# Patient Record
Sex: Male | Born: 1995 | Hispanic: No | Marital: Single | Smoking: Never smoker
Health system: Southern US, Community
[De-identification: ages and names within clinical notes are randomized; demographics above are authoritative.]

## PROBLEM LIST (undated history)

## (undated) DIAGNOSIS — M545 Low back pain: Secondary | ICD-10-CM

## (undated) HISTORY — PX: NO PAST SURGERIES: SHX2092

---
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
## 2013-01-20 IMAGING — CR DG CLAVICLE*L*
2 series · 2 of 2 positions shown · non-contrast
Comparison: Radiographs dated 08/16/2010

CLINICAL DATA: Left shoulder pain secondary to a fall today.

LEFT CLAVICLE - 2+ VIEWS

[w clavicle ap left]
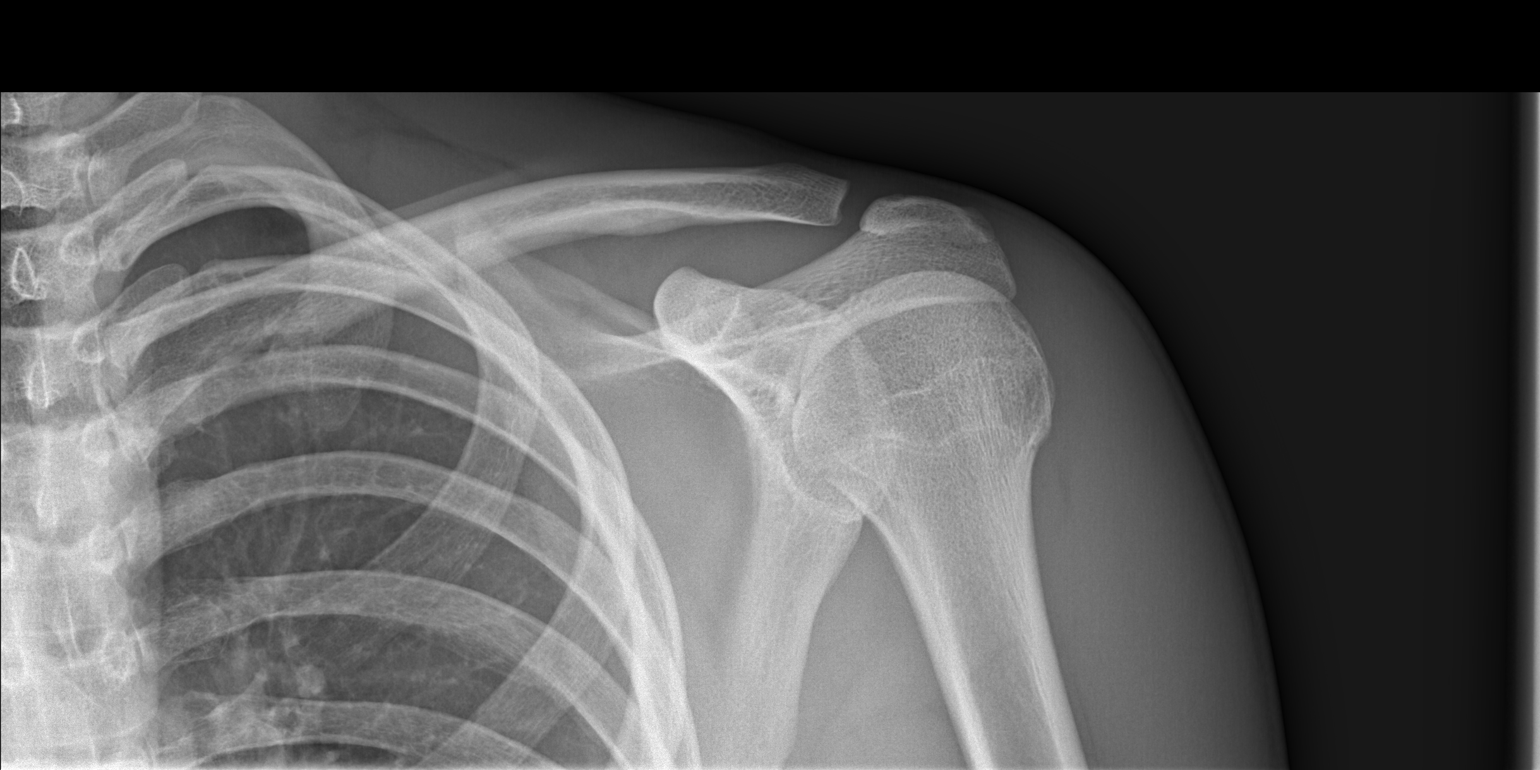

[w clavicle tangential left]
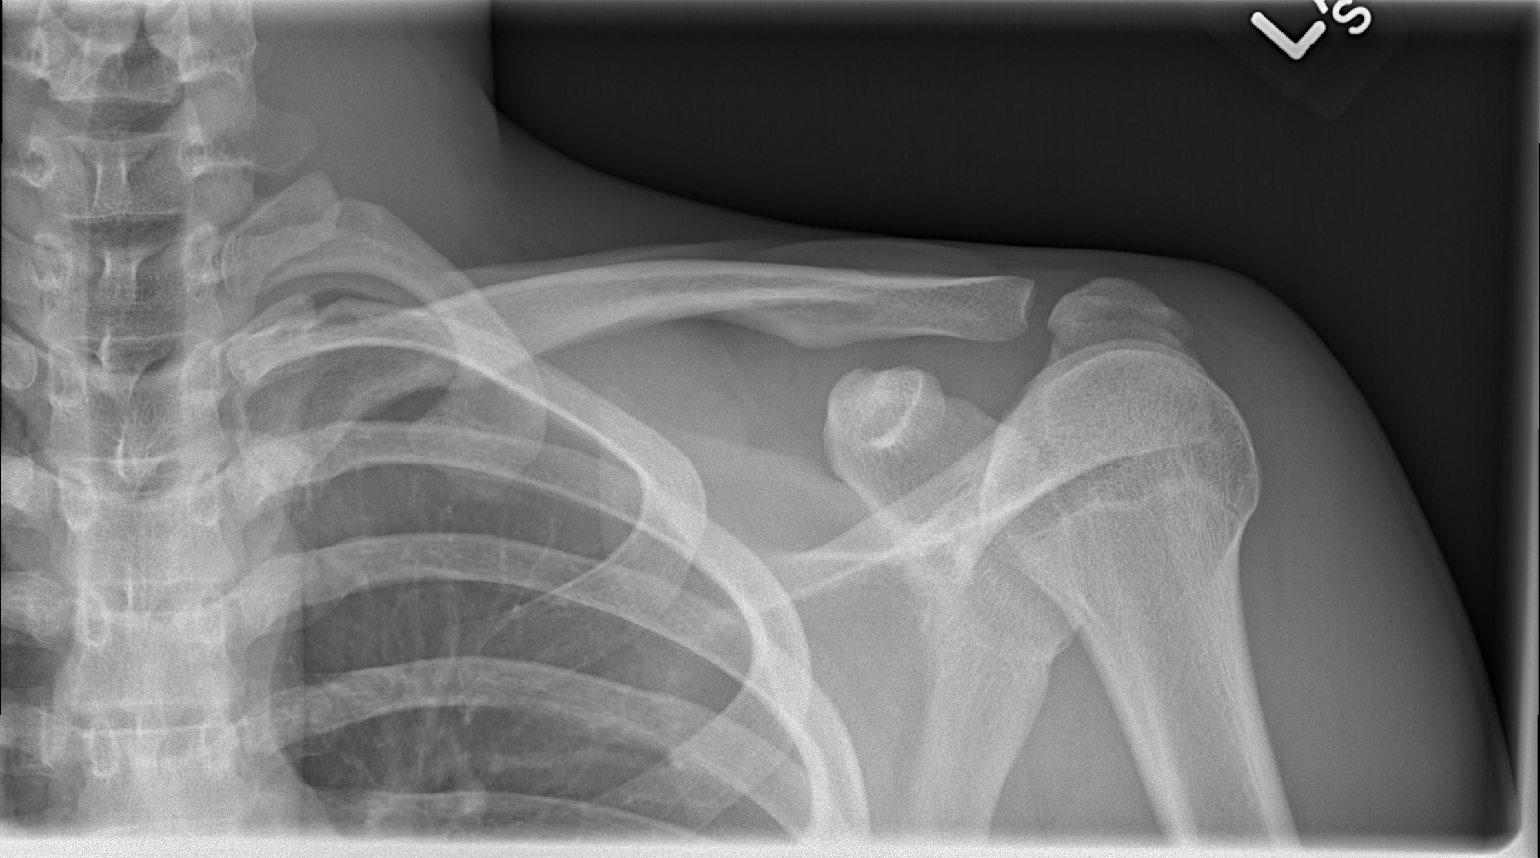

[2 of 2 positions shown; findings below may reference images not displayed]

FINDINGS: There is no fracture or dislocation.  The patient either
has an os acromiale or ununited acromial epiphysis.
IMPRESSION: No acute osseous abnormality.

## 2014-01-23 ENCOUNTER — Encounter (HOSPITAL_COMMUNITY): Payer: BC Managed Care – PPO | Admitting: Certified Registered"

## 2014-01-23 ENCOUNTER — Encounter (HOSPITAL_COMMUNITY)
Admission: AD | Disposition: A | Discharge status: Home / Self Care | Payer: Self-pay | Source: Ambulatory Visit | Attending: Oral Surgery

## 2014-01-23 ENCOUNTER — Encounter (HOSPITAL_COMMUNITY): Payer: Self-pay

## 2014-01-23 ENCOUNTER — Ambulatory Visit (HOSPITAL_COMMUNITY): Payer: BC Managed Care – PPO | Admitting: Certified Registered"

## 2014-01-23 ENCOUNTER — Ambulatory Visit (HOSPITAL_COMMUNITY)
Admission: AD | Admit: 2014-01-23 | Discharge: 2014-01-23 | Disposition: A | Discharge status: Home / Self Care | Payer: BC Managed Care – PPO | Source: Ambulatory Visit | Attending: Oral Surgery | Admitting: Oral Surgery

## 2014-01-23 DIAGNOSIS — IMO0002 Reserved for concepts with insufficient information to code with codable children: Secondary | ICD-10-CM | POA: Insufficient documentation

## 2014-01-23 DIAGNOSIS — Y9366 Activity, soccer: Secondary | ICD-10-CM | POA: Insufficient documentation

## 2014-01-23 DIAGNOSIS — F1210 Cannabis abuse, uncomplicated: Secondary | ICD-10-CM | POA: Insufficient documentation

## 2014-01-23 DIAGNOSIS — S02600A Fracture of unspecified part of body of mandible, initial encounter for closed fracture: Secondary | ICD-10-CM | POA: Insufficient documentation

## 2014-01-23 DIAGNOSIS — M264 Malocclusion, unspecified: Secondary | ICD-10-CM | POA: Insufficient documentation

## 2014-01-23 HISTORY — PX: CLOSED REDUCTION MANDIBLE: SHX5307

## 2014-01-23 SURGERY — CLOSED REDUCTION, MANDIBLE
Anesthesia: General | Laterality: Right

## 2014-01-23 MED ORDER — CEFAZOLIN SODIUM-DEXTROSE 2-3 GM-% IV SOLR
INTRAVENOUS | Status: AC
  Filled 2014-01-23: qty 50

## 2014-01-23 MED ORDER — FENTANYL CITRATE 0.05 MG/ML IJ SOLN
INTRAMUSCULAR | Status: AC
  Filled 2014-01-23: qty 5

## 2014-01-23 MED ORDER — PROPOFOL 10 MG/ML IV BOLUS
INTRAVENOUS | Status: AC
  Filled 2014-01-23: qty 20

## 2014-01-23 MED ORDER — MIDAZOLAM HCL 5 MG/5ML IJ SOLN
INTRAMUSCULAR | Status: DC | PRN
  Administered 2014-01-23: 2 mg via INTRAVENOUS

## 2014-01-23 MED ORDER — HYDROMORPHONE HCL PF 1 MG/ML IJ SOLN
INTRAMUSCULAR | Status: AC
  Filled 2014-01-23: qty 1

## 2014-01-23 MED ORDER — EPINEPHRINE HCL (NASAL) 0.1 % NA SOLN
NASAL | Status: AC
  Filled 2014-01-23: qty 30

## 2014-01-23 MED ORDER — MIDAZOLAM HCL 2 MG/2ML IJ SOLN
INTRAMUSCULAR | Status: AC
  Filled 2014-01-23: qty 2

## 2014-01-23 MED ORDER — DEXAMETHASONE SODIUM PHOSPHATE 4 MG/ML IJ SOLN
INTRAMUSCULAR | Status: DC | PRN
  Administered 2014-01-23: 8 mg via INTRAVENOUS

## 2014-01-23 MED ORDER — ROCURONIUM BROMIDE 50 MG/5ML IV SOLN
INTRAVENOUS | Status: AC
  Filled 2014-01-23: qty 1

## 2014-01-23 MED ORDER — CEFAZOLIN SODIUM-DEXTROSE 2-3 GM-% IV SOLR
2.0000 g | INTRAVENOUS | Status: AC
  Administered 2014-01-23: 2 g via INTRAVENOUS

## 2014-01-23 MED ORDER — FENTANYL CITRATE 0.05 MG/ML IJ SOLN: 50.0000 ug | Freq: Once | INTRAMUSCULAR | Status: DC

## 2014-01-23 MED ORDER — OXYCODONE HCL 5 MG/5ML PO SOLN
5.0000 mg | Freq: Once | ORAL | Status: AC | PRN
  Administered 2014-01-23: 5 mg via ORAL

## 2014-01-23 MED ORDER — LIDOCAINE HCL (CARDIAC) 20 MG/ML IV SOLN
INTRAVENOUS | Status: AC
  Filled 2014-01-23: qty 5

## 2014-01-23 MED ORDER — OXYMETAZOLINE HCL 0.05 % NA SOLN
NASAL | Status: AC
  Filled 2014-01-23: qty 15

## 2014-01-23 MED ORDER — BACIT-POLY-NEO HC 1 % EX OINT
TOPICAL_OINTMENT | CUTANEOUS | Status: AC
  Filled 2014-01-23: qty 15

## 2014-01-23 MED ORDER — ONDANSETRON HCL 4 MG/2ML IJ SOLN
INTRAMUSCULAR | Status: DC | PRN
  Administered 2014-01-23 (×2): 4 mg via INTRAVENOUS

## 2014-01-23 MED ORDER — HYDROMORPHONE HCL PF 1 MG/ML IJ SOLN
0.2500 mg | INTRAMUSCULAR | Status: DC | PRN
  Administered 2014-01-23 (×5): 0.5 mg via INTRAVENOUS

## 2014-01-23 MED ORDER — NEOSTIGMINE METHYLSULFATE 1 MG/ML IJ SOLN
INTRAMUSCULAR | Status: AC
  Filled 2014-01-23: qty 10

## 2014-01-23 MED ORDER — LIDOCAINE HCL (CARDIAC) 20 MG/ML IV SOLN
INTRAVENOUS | Status: DC | PRN
  Administered 2014-01-23: 100 mg via INTRAVENOUS

## 2014-01-23 MED ORDER — OXYCODONE HCL 5 MG/5ML PO SOLN
ORAL | Status: AC
  Filled 2014-01-23: qty 5

## 2014-01-23 MED ORDER — CEFAZOLIN SODIUM-DEXTROSE 2-3 GM-% IV SOLR: 2.0000 g | INTRAVENOUS | Status: DC

## 2014-01-23 MED ORDER — MIDAZOLAM HCL 2 MG/2ML IJ SOLN: 1.0000 mg | INTRAMUSCULAR | Status: DC | PRN

## 2014-01-23 MED ORDER — ROCURONIUM BROMIDE 100 MG/10ML IV SOLN
INTRAVENOUS | Status: DC | PRN
  Administered 2014-01-23: 40 mg via INTRAVENOUS

## 2014-01-23 MED ORDER — LACTATED RINGERS IV SOLN
INTRAVENOUS | Status: DC
  Administered 2014-01-23: 15:00:00 via INTRAVENOUS

## 2014-01-23 MED ORDER — GLYCOPYRROLATE 0.2 MG/ML IJ SOLN
INTRAMUSCULAR | Status: AC
  Filled 2014-01-23: qty 3

## 2014-01-23 MED ORDER — LACTATED RINGERS IV SOLN
INTRAVENOUS | Status: DC | PRN
  Administered 2014-01-23 (×2): via INTRAVENOUS

## 2014-01-23 MED ORDER — ONDANSETRON HCL 4 MG/2ML IJ SOLN
INTRAMUSCULAR | Status: AC
  Filled 2014-01-23: qty 2

## 2014-01-23 MED ORDER — FENTANYL CITRATE 0.05 MG/ML IJ SOLN
INTRAMUSCULAR | Status: DC | PRN
  Administered 2014-01-23: 100 ug via INTRAVENOUS
  Administered 2014-01-23: 50 ug via INTRAVENOUS
  Administered 2014-01-23: 100 ug via INTRAVENOUS

## 2014-01-23 MED ORDER — PROMETHAZINE HCL 25 MG/ML IJ SOLN
INTRAMUSCULAR | Status: AC
  Filled 2014-01-23: qty 1

## 2014-01-23 MED ORDER — LIDOCAINE-EPINEPHRINE 1 %-1:100000 IJ SOLN
INTRAMUSCULAR | Status: AC
  Filled 2014-01-23: qty 1

## 2014-01-23 MED ORDER — GLYCOPYRROLATE 0.2 MG/ML IJ SOLN
INTRAMUSCULAR | Status: DC | PRN
  Administered 2014-01-23: .6 mg via INTRAVENOUS

## 2014-01-23 MED ORDER — PROPOFOL 10 MG/ML IV BOLUS
INTRAVENOUS | Status: DC | PRN
  Administered 2014-01-23: 180 mg via INTRAVENOUS

## 2014-01-23 MED ORDER — OXYCODONE HCL 5 MG PO TABS: 5.0000 mg | ORAL_TABLET | Freq: Once | ORAL | Status: AC | PRN

## 2014-01-23 MED ORDER — NEOSTIGMINE METHYLSULFATE 1 MG/ML IJ SOLN
INTRAMUSCULAR | Status: DC | PRN
  Administered 2014-01-23: 4 mg via INTRAVENOUS

## 2014-01-23 MED ORDER — HYDROCODONE-ACETAMINOPHEN 7.5-325 MG/15ML PO SOLN: 15.0000 mL | ORAL | Status: AC | PRN

## 2014-01-23 MED ORDER — PROMETHAZINE HCL 25 MG/ML IJ SOLN
6.2500 mg | INTRAMUSCULAR | Status: DC | PRN
  Administered 2014-01-23: 6.25 mg via INTRAVENOUS

## 2014-01-23 SURGICAL SUPPLY — 30 items
CANISTER SUCTION 2500CC (MISCELLANEOUS) ×3 IMPLANT
CLEANER TIP ELECTROSURG 2X2 (MISCELLANEOUS) ×3 IMPLANT
CLOTH BEACON ORANGE TIMEOUT ST (SAFETY) ×3 IMPLANT
COVER SURGICAL LIGHT HANDLE (MISCELLANEOUS) ×6 IMPLANT
DECANTER SPIKE VIAL GLASS SM (MISCELLANEOUS) ×3 IMPLANT
ELECT REM PT RETURN 9FT ADLT (ELECTROSURGICAL) ×3
ELECTRODE REM PT RTRN 9FT ADLT (ELECTROSURGICAL) ×1 IMPLANT
GLOVE BIO SURGEON STRL SZ 6.5 (GLOVE) ×2 IMPLANT
GLOVE BIO SURGEON STRL SZ7.5 (GLOVE) ×3 IMPLANT
GLOVE BIO SURGEONS STRL SZ 6.5 (GLOVE) ×1
GOWN STRL NON-REIN LRG LVL3 (GOWN DISPOSABLE) ×6 IMPLANT
KIT BASIN OR (CUSTOM PROCEDURE TRAY) ×3 IMPLANT
KIT ROOM TURNOVER OR (KITS) ×3 IMPLANT
NS IRRIG 1000ML POUR BTL (IV SOLUTION) ×3 IMPLANT
PAD ARMBOARD 7.5X6 YLW CONV (MISCELLANEOUS) ×6 IMPLANT
PENCIL BUTTON HOLSTER BLD 10FT (ELECTRODE) ×3 IMPLANT
SCISSORS WIRE ANG 4 3/4 DISP (INSTRUMENTS) ×3 IMPLANT
SUT CHROMIC 3 0 PS 2 (SUTURE) ×3 IMPLANT
SUT CHROMIC 4 0 PS 2 18 (SUTURE) ×3 IMPLANT
SUT ETHILON 5 0 P 3 18 (SUTURE) ×2
SUT NYLON ETHILON 5-0 P-3 1X18 (SUTURE) ×1 IMPLANT
SUT SILK 2 0 FS (SUTURE) IMPLANT
SUT STEEL 0 (SUTURE)
SUT STEEL 0 18XMFL TIE 17 (SUTURE) IMPLANT
SUT STEEL 1 (SUTURE) ×6 IMPLANT
SUT STEEL 4 (SUTURE) IMPLANT
TOWEL OR 17X24 6PK STRL BLUE (TOWEL DISPOSABLE) ×3 IMPLANT
TOWEL OR 17X26 10 PK STRL BLUE (TOWEL DISPOSABLE) ×3 IMPLANT
TRAY ENT MC OR (CUSTOM PROCEDURE TRAY) ×3 IMPLANT
WATER STERILE IRR 1000ML POUR (IV SOLUTION) ×3 IMPLANT

## 2014-01-23 NOTE — H&P (Signed)
Brandon ForesterMarco Mahoney is an [age] y.o. male.   Chief Complaint: pain bite off right mandible HPI: hit in face during soccer practice saturday  Past Medical History  Diagnosis Date  . Lower back pain 2010    stress fx- lumbar region    Past Surgical History  Procedure Laterality Date  . No past surgeries      No family history on file. Social History:  reports that he has never smoked. He does not have any smokeless tobacco history on file. He reports that he uses illicit drugs (Marijuana). He reports that he does not drink alcohol.  Allergies: No Known Allergies  Medications Prior to Admission  Medication Sig Dispense Refill  . oxyCODONE-acetaminophen (PERCOCET/ROXICET) 5-325 MG per tablet Take 1 tablet by mouth every 6 (six) hours as needed for severe pain.        No results found for this or any previous visit (from the past 48 hour(s)). No results found.  ROS  Blood pressure 150/81, pulse 60, temperature 98.1 F (36.7 C), temperature source Oral, resp. rate 18, height 5\' 9"  (1.753 m), weight 68.04 kg (150 lb), SpO2 100.00%. Physical Exam  Constitutional: He is oriented to person, place, and time. He appears well-developed and well-nourished.  HENT:  Head: Normocephalic. Head is with abrasion and with contusion.    Right Ear: External ear normal.  Left Ear: External ear normal.  Mouth/Throat: Oropharynx is clear and moist.    Eyes: Conjunctivae and EOM are normal. Pupils are equal, round, and reactive to light.  Neck: Normal range of motion. Neck supple.  Cardiovascular: Normal rate, regular rhythm, normal heart sounds and intact distal pulses.   Respiratory: Effort normal and breath sounds normal.  GI: Soft. Bowel sounds are normal.  Musculoskeletal: Normal range of motion.  Neurological: He is alert and oriented to person, place, and time. He has normal reflexes.  Skin: Skin is warm and dry.  Psychiatric: He has a normal mood and affect. His behavior is normal. Judgment and  thought content normal.     Assessment/Plan Fractured mandible right body  Hermila Millis,JOSEPH L 01/23/2014, 3:28 PM

## 2014-01-23 NOTE — Pre-Procedure Instructions (Signed)
Correction,  Space between teeth is between #28 and #29.

## 2014-01-23 NOTE — Anesthesia Procedure Notes (Signed)
Procedure Name: Intubation Date/Time: 01/23/2014 5:10 PM Performed by: Armandina GemmaMIRARCHI, Audiel Scheiber Pre-anesthesia Checklist: Patient identified, Timeout performed, Emergency Drugs available, Suction available and Patient being monitored Patient Re-evaluated:Patient Re-evaluated prior to inductionOxygen Delivery Method: Circle system utilized Preoxygenation: Pre-oxygenation with 100% oxygen Intubation Type: IV induction Grade View: Grade I Nasal Tubes: Right and Nasal Rae Tube size: 7.0 mm Number of attempts: 1 Placement Confirmation: ETT inserted through vocal cords under direct vision,  breath sounds checked- equal and bilateral and positive ETCO2 Secured at: 29 cm Tube secured with: Tape Dental Injury: Teeth and Oropharynx as per pre-operative assessment  Comments: IV induction Kasik- 14 fr red rubber to the end of 7.0 nasal rae tube- 14 fr red rubber inserted to right nare by Kasik- followed by 7.0 RAE nasal- Glidescope- inserted- ETT thru cords- atraumatic teeth and mouth as preop- bilat BS Kasik

## 2014-01-23 NOTE — Op Note (Signed)
The patient was brought to the operating room in the supine position and which remained throughout the whole procedure. He was intubated via right nasoendotracheal tube and prepped and draped in usual fashion for an intraoral procedure. The mouth was prepped with Betadine scrub and paint. An open moist 4 x 4 gauze was placed around the endotracheal tube. The maxillary arch was measured from tooth #3 to #14. An Erich arch bar was cut to fit the arch. 25 gauge wire was then used to pass around each of the teeth including #3 through #14. Each wire was then passed around the [place] arch part and turned into the mucobuccal fold in a clockwise fashion. The arch bar was checked for stability. The mandibular fracture was found to be unstable in mobility and a wire was passed around #28 and #29. The wire was tightened down bringing the fractures together and stabilizing the fractures. Lingual palpation was also done to position the fracture in the proper relationship. The occlusion was checked at this point and appear appropriate. The mandibular arch was then measured from #19 to #30. In [place] arch bar was cut to fit the mandibular arch. 25-gauge stainless steel wires were passed around the teeth extending from #19 to #30 and around the [facility] arch bar. Each wire was then turned in a clockwise fashion into the mucobuccal fold and then downward. The mandible was then placed against the maxilla found to be in the same position. The throat pack was removed after suctioning out the throat. 25-gauge stainless steel IMF wires were then passed to hold the Erich arch bars together. The fractures appeared stable. The inferior border appears to be symmetrical. The patient was then extubated on the table and returned to the recovery room in good condition. The patient already has amoxicillin elixir and pain medication. He will be followed by me and get a postop x-ray in the morning in my office.

## 2014-01-23 NOTE — Consult Note (Signed)
  This is a 18 y/o Hispanic male who was struck in the face while practicing soccer.  He was hit on the right side of the jaw.  There is a paresthesia of the lower right lip and chin.  There is a malocclusion as well with a wide space between #27 and #28.  Xrays show a moderately displaced body of the mandible fracture in a sagittal plane.  The treatment plan is to do a closed reduction with [place]ece FittingErich Arch bars and IMF. We will open the fracture only as necessary.

## 2014-01-23 NOTE — H&P (Signed)
No change in medical history.  

## 2014-01-23 NOTE — Preoperative (Signed)
Beta Blockers   Reason not to administer Beta Blockers:Not Applicable 

## 2014-01-23 NOTE — Transfer of Care (Signed)
Immediate Anesthesia Transfer of Care Note  Patient: Brandon ForesterMarco Mahoney  Procedure(s) Performed: Procedure(s): CLOSED REDUCTION MANDIBLE WITH ARCH BAR FIXATION (Right)  Patient Location: PACU  Anesthesia Type:General  Level of Consciousness: awake and alert   Airway & Oxygen Therapy: Patient Spontanous Breathing and Patient connected to face mask oxygen  Post-op Assessment: Report given to PACU RN and Post -op Vital signs reviewed and stable  Post vital signs: Reviewed and stable  Complications: No apparent anesthesia complications

## 2014-01-23 NOTE — Anesthesia Postprocedure Evaluation (Signed)
Anesthesia Post Note  Patient: Brandon Mahoney  Procedure(s) Performed: Procedure(s) (LRB): CLOSED REDUCTION MANDIBLE WITH ARCH BAR FIXATION (Right)  Anesthesia type: General  Patient location: PACU  Post pain: Pain level controlled and Adequate analgesia  Post assessment: Post-op Vital signs reviewed, Patient's Cardiovascular Status Stable, Respiratory Function Stable, Patent Airway and Pain level controlled  Last Vitals:  Filed Vitals:   01/23/14 1900  BP: 150/85  Pulse: 55  Temp:   Resp: 12    Post vital signs: Reviewed and stable  Level of consciousness: awake, alert  and oriented  Complications: No apparent anesthesia complications

## 2014-01-23 NOTE — Progress Notes (Signed)
Call to Dr. Hyacinth MeekerMiller 's office, requested that orders be entered into EPIC.

## 2014-01-23 NOTE — Anesthesia Preprocedure Evaluation (Addendum)
Anesthesia Evaluation  Patient identified by MRN, date of birth, ID band Patient awake    Reviewed: Allergy & Precautions, H&P , NPO status , Patient's Chart, lab work & pertinent test results  Airway Mallampati: II TM Distance: >3 FB Neck ROM: Limited  Mouth opening: Limited Mouth Opening  Dental  (+) Dental Advisory Given   Pulmonary  breath sounds clear to auscultation        Cardiovascular Rhythm:Regular Rate:Normal     Neuro/Psych    GI/Hepatic (+)     substance abuse  marijuana use,   Endo/Other    Renal/GU      Musculoskeletal   Abdominal   Peds  Hematology   Anesthesia Other Findings Mandible fx  Reproductive/Obstetrics                          Anesthesia Physical Anesthesia Plan  ASA: II  Anesthesia Plan: General   Post-op Pain Management:    Induction: Intravenous  Airway Management Planned: Nasal ETT and Video Laryngoscope Planned  Additional Equipment:   Intra-op Plan:   Post-operative Plan: Extubation in OR  Informed Consent: I have reviewed the patients History and Physical, chart, labs and discussed the procedure including the risks, benefits and alternatives for the proposed anesthesia with the patient or authorized representative who has indicated his/her understanding and acceptance.     Plan Discussed with: CRNA and Surgeon  Anesthesia Plan Comments:         Anesthesia Quick Evaluation

## 2014-01-23 NOTE — H&P (Signed)
  This is a 18 y/o Hispanic male who was struck in the face while practicing soccer.  He was hit on the right side of the jaw.  There is a paresthesia of the lower right lip and chin.  There is a malocclusion as well with a wide space between #27 and #28.  Xrays show a moderately displaced body of the mandible fracture in a sagittal plane.  The treatment plan is to do a closed reduction with Erich Arch bars and IMF. We will open the fracture only as necessary.  

## 2014-01-23 NOTE — Brief Op Note (Signed)
01/23/2014  6:27 PM  PATIENT:  Warnell ForesterMarco Waggoner  18 y.o. male  PRE-OPERATIVE DIAGNOSIS:  Right Mandible Fx  POST-OPERATIVE DIAGNOSIS:  same  PROCEDURE:  Procedure(s): CLOSED REDUCTION MANDIBLE WITH ARCH BAR FIXATION (Right)  SURGEON:  Surgeon(s) and Role:    * Hinton DyerJoseph L Redford Behrle, DDS - Primary  PHYSICIAN ASSISTANT:   ASSISTANTS: OR tech    ANESTHESIA:   general  EBL:  Total I/O In: 1000 [I.V.:1000] Out: 10 [Blood:10]  BLOOD ADMINISTERED:none  DRAINS: none   LOCAL MEDICATIONS USED:  NONE  SPECIMEN:  No Specimen  DISPOSITION OF SPECIMEN:  N/A  COUNTS:  YES  TOURNIQUET:  * No tourniquets in log *  DICTATION: .Dragon Dictation  PLAN OF CARE: Discharge to home after PACU  PATIENT DISPOSITION:  PACU - hemodynamically stable.   Delay start of Pharmacological VTE agent (>24hrs) due to surgical blood loss or risk of bleeding: not applicable

## 2014-01-24 ENCOUNTER — Encounter (HOSPITAL_COMMUNITY): Payer: Self-pay | Admitting: Oral Surgery

## 2014-04-17 ENCOUNTER — Other Ambulatory Visit: Payer: Self-pay | Admitting: Family Medicine

## 2014-04-17 ENCOUNTER — Encounter: Payer: Self-pay | Admitting: Family Medicine

## 2014-04-17 DIAGNOSIS — S7010XA Contusion of unspecified thigh, initial encounter: Secondary | ICD-10-CM | POA: Insufficient documentation

## 2014-04-17 MED ORDER — NITROGLYCERIN 0.2 MG/HR TD PT24: MEDICATED_PATCH | TRANSDERMAL | Status: AC

## 2014-04-17 MED ORDER — MELOXICAM 15 MG PO TABS
15.0000 mg | ORAL_TABLET | Freq: Every day | ORAL | Status: AC
Start: 2014-04-17 — End: ?

## 2014-04-17 NOTE — Assessment & Plan Note (Signed)
Patient does have a quadricep tear and patient's knee seems to be stable. Patient will begin compression sleeve, icing protocol, anti-inflammatories a nitroglycerin patch. 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 Patient is planning on a [place]iprofessional [place]. Patient will come back in 2 weeks for further evaluation.

## 2014-04-17 NOTE — Patient Instructions (Signed)
Meloxicam daily 10 days then as needed Ice 20 minutes 2 times daily.  Exercises 3 times weekly.  Nitroglycerin Protocol   Apply 1/4 nitroglycerin patch to affected area daily.  Change position of patch within the affected area every 24 hours.  You may experience a headache during the first 1-2 weeks of using the patch, these should subside.  If you experience headaches after beginning nitroglycerin patch treatment, you may take your preferred over the counter pain reliever.  Another side effect of the nitroglycerin patch is skin irritation or rash related to patch adhesive.  Please notify our office if you develop more severe headaches or rash, and stop the patch.  Tendon healing with nitroglycerin patch may require 12 to 24 weeks depending on the extent of injury.  Men should not use if taking Viagra, Cialis, or Levitra.   Do not use if you have migraines or rosacea.   Come back in 2 weeks.  Cell [phone] zach Katrinka Blazingsmith

## 2014-04-17 NOTE — Progress Notes (Signed)
Tawana ScaleZach Smith D.O. [facility] [address] [place], [place] [address] Phone: [phone] Subjective:     CC: left knee pain.   QIO:[id number]:Subjective Brandon ForesterMarco Mahoney is a 18 y.o. male coming in with complaint of left knee pain. Patient played in a soccer game on Saturday evening. Patient was unfortunately slide tackle and a significant amount of pain when the knee it just above his left knee. Patient denies been plated. Patient states that he has significant swelling and bruising also medially. Patient was unable to bear weight and still does not want to bear weight on this leg. Denies any radiation of pain or any numbness. Patient describes it is more of a dull aching pain in does have sharp pain when he tries to bear weight. Patient denies any instability of the knee though. Denies any pain of the knee. Patient has never injured this leg previously. Patient is a severity of 8/10. Has tried some over-the-counter medications with minimal improvement.     Past medical history, social, surgical and family history all reviewed in electronic medical record.   Review of Systems: No headache, visual changes, nausea, vomiting, diarrhea, constipation, dizziness, abdominal pain, skin rash, fevers, chills, night sweats, weight loss, swollen lymph nodes, body aches, joint swelling, muscle aches, chest pain, shortness of breath, mood changes.   Objective There were no vitals taken for this visit.  General: No apparent distress alert and oriented x3 mood and affect normal, dressed appropriately.  HEENT: Pupils equal, extraocular movements intact  Respiratory: Patient's speak in full sentences and does not appear short of breath  Cardiovascular: No lower extremity edema, non tender, no erythema  Skin: Warm dry intact with no signs of infection or rash on extremities or on axial skeleton.  Abdomen: Soft nontender  Neuro: Cranial nerves II through XII are intact, neurovascularly intact in all extremities  with 2+ DTRs and 2+ pulses.  Lymph: No lymphadenopathy of posterior or anterior cervical chain or axillae bilaterally.  Gait normal with good balance and coordination.  MSK:  Non tender with full range of motion and good stability and symmetric strength and tone of shoulders, elbows, wrist, hip,a nd ankles bilaterally.  Knee: Left Normal to inspection patient does have a good amount of swelling of the medial and anterior aspect of the quadricep. Palpation normal with no warmth, joint line tenderness, patellar tenderness, patient has tenderness over the quadricep and mild over the condyle of the medial aspect of the left knee. ROM his decrease in flexion secondary to swelling full extension noted. Ligaments with solid consistent endpoints including ACL, PCL, LCL, MCL. Negative Mcmurray's, Apley's, and Thessalonian tests. Non painful patellar compression. Patellar glide without crepitus. Patellar and quadriceps tendons unremarkable. Hamstring and quadriceps strength is normal.  Contralateral knee unremarkable.  MSK US performed of: Left knee This study was ordered, performed, and interpreted by Terrilee FilesZach Smith D.O.  Knee: All structures visualized. Anteromedial, anterolateral, posteromedial, and posterolateral menisci unremarkable without tearing, fraying, effusion, or displacement. Patient does have a tear that measures 1.36 cm in diameter of the vastus medialis oblique muscle. This is on the medial border. Right at the musculotendinous juncture. Increasing Doppler flow noted. Patellar Tendon unremarkable on long and transverse views without effusion. No abnormality of prepatellar bursa. LCL and MCL unremarkable on long and transverse views. No abnormality of origin of medial or lateral head of the gastrocnemius.  IMPRESSION:  Quadricep contusion and tear.     Impression and Recommendations:     This case required  medical decision making of moderate complexity.

## 2014-05-02 ENCOUNTER — Ambulatory Visit: Payer: 59 | Admitting: Family Medicine

## 2014-05-02 ENCOUNTER — Encounter: Payer: Self-pay | Admitting: Family Medicine

## 2014-05-02 DIAGNOSIS — S7010XA Contusion of unspecified thigh, initial encounter: Secondary | ICD-10-CM

## 2014-05-02 NOTE — Progress Notes (Signed)
  [facility] [address] [place], [place] [address] Phone: [phone] Subjective:     CC: left knee pain. Followup  OAC:[id number] Brandon Mahoney is a 18 y.o. male coming in with complaint of left knee pain. Patient states that the pain is significantly better at this time. Patient is actually having no pain and is able to ambulate without any trouble. Patient is no longer taking anti-inflammatories and is only wearing the brace as needed. Patient has not been doing any exercises though. Patient states that it feels somewhat weak compared to the other side but overall is feeling much better. No new symptoms.     Past medical history, social, surgical and family history all reviewed in electronic medical record.   Review of Systems: No headache, visual changes, nausea, vomiting, diarrhea, constipation, dizziness, abdominal pain, skin rash, fevers, chills, night sweats, weight loss, swollen lymph nodes, body aches, joint swelling, muscle aches, chest pain, shortness of breath, mood changes.   Objective There were no vitals taken for this visit.  General: No apparent distress alert and oriented x3 mood and affect normal, dressed appropriately.  HEENT: Pupils equal, extraocular movements intact  Respiratory: Patient's speak in full sentences and does not appear short of breath  Cardiovascular: No lower extremity edema, non tender, no erythema  Skin: Warm dry intact with no signs of infection or rash on extremities or on axial skeleton.  Abdomen: Soft nontender  Neuro: Cranial nerves II through XII are intact, neurovascularly intact in all extremities with 2+ DTRs and 2+ pulses.  Lymph: No lymphadenopathy of posterior or anterior cervical chain or axillae bilaterally.  Gait normal with good balance and coordination.  MSK:  Non tender with full range of motion and good stability and symmetric strength and tone of shoulders, elbows, wrist, hip,a nd ankles  bilaterally.  Knee: Left No swelling noted. Palpation normal with no warmth, joint line tenderness, patellar tenderness,  Nontender on exam Full range of motion Ligaments with solid consistent endpoints including ACL, PCL, LCL, MCL. Negative Mcmurray's, Apley's, and Thessalonian tests. Non painful patellar compression. Patellar glide without crepitus. Patellar and quadriceps tendons unremarkable. Hamstring and quadriceps strength is normal.  Contralateral knee unremarkable.  MSK US performed of: Left knee This study was ordered, performed, and interpreted by Terrilee Files D.O.  Knee: All structures visualized. Anteromedial, anterolateral, posteromedial, and posterolateral menisci unremarkable without tearing, fraying, effusion, or displacement. Area of tear in the vastus medialis oblique muscle has completely resolved with scar tissue formation no. No gapping noted. Patellar Tendon unremarkable on long and transverse views without effusion. No abnormality of prepatellar bursa. LCL and MCL unremarkable on long and transverse views. No abnormality of origin of medial or lateral head of the gastrocnemius.  IMPRESSION:  Quadricep tear healed with scar tissue formation     Impression and Recommendations:     This case required medical decision making of moderate complexity.

## 2014-05-02 NOTE — Assessment & Plan Note (Signed)
Patient's tear has completely healed at this time. Patient is doing very well and will start phase 2 of exercises. Patient given some more strengthening exercises as well as starting to do more specific activity. Patient was given a gradual progression over the course of the next 2 weeks to get him back again physical activity. Patient will continue to nitroglycerin patch at this time and will wear her brace as needed. Patient can follow up again in 3 weeks if not giving much better.

## 2014-05-02 NOTE — Patient Instructions (Signed)
Good to see you Continue the ice especially after activity Ok to start running in straight line but no cutting or sprinting.   Ok to dribble or juggle.  Take 1 week then increase sport specific drills, cutting is good some sprinting.  OK to lift at 50% of what you were doing and increase 10 % a week. Wear compression during workout.  If doing start full training on the 11th. If good then ok to play the 19th.
